# Patient Record
Sex: Female | Born: 1950 | Race: Black or African American | Hispanic: No | Marital: Married | Smoking: Current every day smoker
Health system: Southern US, Community
[De-identification: ages and names within clinical notes are randomized; demographics above are authoritative.]

---

## 1998-11-14 ENCOUNTER — Inpatient Hospital Stay (HOSPITAL_COMMUNITY): Admission: AD | Admit: 1998-11-14 | Discharge: 1998-11-14 | Payer: Self-pay | Admitting: *Deleted

## 1999-08-20 ENCOUNTER — Encounter: Payer: Self-pay | Admitting: *Deleted

## 1999-08-20 ENCOUNTER — Emergency Department (HOSPITAL_COMMUNITY): Admission: EM | Admit: 1999-08-20 | Discharge: 1999-08-20 | Payer: Self-pay | Admitting: Emergency Medicine

## 1999-08-20 ENCOUNTER — Ambulatory Visit (HOSPITAL_COMMUNITY): Admission: RE | Admit: 1999-08-20 | Discharge: 1999-08-20 | Payer: Self-pay | Admitting: *Deleted

## 1999-08-20 ENCOUNTER — Encounter: Payer: Self-pay | Admitting: Emergency Medicine

## 2009-03-14 ENCOUNTER — Emergency Department (HOSPITAL_COMMUNITY): Admission: EM | Admit: 2009-03-14 | Discharge: 2009-03-14 | Payer: Self-pay | Admitting: Emergency Medicine

## 2011-10-11 ENCOUNTER — Emergency Department (INDEPENDENT_AMBULATORY_CARE_PROVIDER_SITE_OTHER)
Admission: EM | Admit: 2011-10-11 | Discharge: 2011-10-11 | Disposition: A | Discharge status: Home / Self Care | Payer: Self-pay | Source: Home / Self Care | Attending: Family Medicine | Admitting: Family Medicine

## 2011-10-11 ENCOUNTER — Emergency Department (INDEPENDENT_AMBULATORY_CARE_PROVIDER_SITE_OTHER): Payer: Self-pay

## 2011-10-11 ENCOUNTER — Encounter (HOSPITAL_COMMUNITY): Payer: Self-pay | Admitting: *Deleted

## 2011-10-11 DIAGNOSIS — S92919A Unspecified fracture of unspecified toe(s), initial encounter for closed fracture: Secondary | ICD-10-CM

## 2011-10-11 DIAGNOSIS — S92402A Displaced unspecified fracture of left great toe, initial encounter for closed fracture: Secondary | ICD-10-CM

## 2011-10-11 MED ORDER — HYDROCODONE-ACETAMINOPHEN 5-325 MG PO TABS: 1.0000 | ORAL_TABLET | Freq: Four times a day (QID) | ORAL | Status: AC | PRN

## 2011-10-11 NOTE — ED Notes (Signed)
Reports jamming left great toe on stair step @ 0100 this AM.  Ecchymosis noted to joint.  C/O painful ambulation.

## 2011-10-11 NOTE — ED Notes (Signed)
Left great toe digital block performed per Dr. Artis Flock, followed by reduction.  Patient tolerated well.

## 2011-10-11 NOTE — ED Provider Notes (Signed)
History     CSN: [id number]  Arrival date & time 10/11/11  1330   First MD Initiated Contact with Patient 10/11/11 1411      Chief Complaint  Patient presents with  . Toe Injury    (Consider location/radiation/quality/duration/timing/severity/associated sxs/prior treatment) Patient is a 61 y.o. female presenting with toe pain. The history is provided by the patient.  Toe Pain This is a new problem. The current episode started 12 to 24 hours ago. The problem has not changed since onset.   History reviewed. No pertinent past medical history.  History reviewed. No pertinent past surgical history.  No family history on file.  History  Substance Use Topics  . Smoking status: Current Everyday Smoker -- 0.5 packs/day  . Smokeless tobacco: Not on file  . Alcohol Use: Yes     Occasional    OB History    Grav Para Term Preterm Abortions TAB SAB Ect Mult Living                  Review of Systems  Musculoskeletal: Positive for joint swelling and gait problem.    Allergies  Review of patient's allergies indicates no known allergies.  Home Medications  No current outpatient prescriptions on file.  BP 119/80  Pulse 79  Temp 98 F (36.7 C) (Oral)  Resp 19  SpO2 100%  Physical Exam  Nursing note and vitals reviewed. Constitutional: She is oriented to person, place, and time. She appears well-developed and well-nourished.  Musculoskeletal: She exhibits tenderness.  Neurological: She is alert and oriented to person, place, and time.  Skin: Skin is warm and dry.    ED Course  Reduction of fracture Date/Time: 10/11/2011 3:21 PM Performed by: Linna Hoff Authorized by: Bradd Canary D Consent: Verbal consent obtained. Consent given by: patient Patient understanding: patient states understanding of the procedure being performed Preparation: Patient was prepped and draped in the usual sterile fashion. Local anesthesia used: yes Anesthesia: digital block Local  anesthetic: lidocaine 2% without epinephrine Anesthetic total: 3 ml Patient sedated: no Patient tolerance: Patient tolerated the procedure well with no immediate complications. Comments: Post red x-ray improved alignment.   (including critical care time)  Labs Reviewed - No data to display Dg Foot 2 Views Left  10/11/2011  *RADIOLOGY REPORT*  Clinical Data: Toe fracture.  Post reduction images.  LEFT FOOT - 2 VIEW  Comparison: 10/11/2011.  Findings: Alignment of the proximal phalanx of the left great toe is improved, with less apex medial angulation at the oblique fracture site.  On the frontal view, the alignment is near anatomic.  There is minimal apex volar angulation of the lateral view.  The first and second toes appear taped together.  IMPRESSION: Improved alignment of great toe proximal phalanx fracture.  Original Report Authenticated By: Andreas Newport, M.D.   Dg Foot Complete Left  10/11/2011  *RADIOLOGY REPORT*  Clinical Data: Blunt trauma to the left foot, now with obvious deformity of the left great toe  LEFT FOOT - COMPLETE 3+ VIEW  Comparison: None.  Findings: There is a minimally displaced fracture of the mid aspect of the proximal phalanx of the great toe, apex valgus and plantar. This fracture is without definite extension to either in the IP or the MTP joints.  Possible nondisplaced fracture of the medial aspect of the proximal epiphysis of the distal phalanx of the great toe.  Expected adjacent soft tissue swelling.  No radiopaque foreign body.  IMPRESSION: 1.  Minimally displaced fracture  of the proximal phalanx of the great toe without definite intra-articular extension.  2. Possible nondisplaced fracture of the medial aspect of the proximal epiphyses of the distal phalanx of the great toe.  Original Report Authenticated By: Waynard Reeds, M.D.     1. Fracture of great toe, left, closed       MDM  X-rays reviewed and report per radiologist.   Discussed with dr Dion Saucier,  treatment as advised.         Linna Hoff, MD 10/11/11 [phone]

## 2017-01-09 ENCOUNTER — Encounter (HOSPITAL_COMMUNITY): Payer: Self-pay | Admitting: Family Medicine

## 2017-01-09 ENCOUNTER — Ambulatory Visit (HOSPITAL_COMMUNITY)
Admission: EM | Admit: 2017-01-09 | Discharge: 2017-01-09 | Disposition: A | Discharge status: Home / Self Care | Payer: Medicare Other | Attending: Physician Assistant | Admitting: Physician Assistant

## 2017-01-09 DIAGNOSIS — H1032 Unspecified acute conjunctivitis, left eye: Secondary | ICD-10-CM

## 2017-01-09 MED ORDER — FLUORESCEIN SODIUM 0.6 MG OP STRP
ORAL_STRIP | OPHTHALMIC | Status: AC
  Filled 2017-01-09: qty 1

## 2017-01-09 MED ORDER — SULFACETAMIDE SODIUM 10 % OP SOLN: 1.0000 [drp] | Freq: Four times a day (QID) | OPHTHALMIC | 0 refills | Status: AC

## 2017-01-09 MED ORDER — TETRACAINE HCL 0.5 % OP SOLN
OPHTHALMIC | Status: AC
  Filled 2017-01-09: qty 4

## 2017-01-09 NOTE — Discharge Instructions (Signed)
Use sulfacetamide drops as directed. Use over the counter lid scrubs or baby shampoo to was the eyelid. Warm compress to help oil production. Do not rub the eye, keep good hand hygiene as you can pass the bacteria to your other eye, or to others. Monitor for any worsening of symptoms, changes in vision, sensitivity to light, swelling of the eye, go to the ophthalmologist for further evaluation.

## 2017-01-09 NOTE — ED Provider Notes (Signed)
[facility]    CSN: [id number] Arrival date & time: 01/09/17  1214     History   Chief Complaint Chief Complaint  Patient presents with  . Eye Problem    HPI Kaylee Bell is a 66 y.o. female.   66 year old healthy female comes in for left eye drainage, redness and swelling starting this morning. States she woke up with the symptoms. Denies vision changes. Has had some photophobia. States pain when eye is closed. Denies seasonal allergies. Denies contact lens use. Denies URI symptoms such as cough, congestion, sore throat. Denies fever, chills. Does have hot flashes. Denies injury to eye, foreign body. Does state that her son and grandchildren are all being treated for bacterial conjunctivitis and she has been playing with the grandchildren lately.       History reviewed. No pertinent past medical history.  There are no active problems to display for this patient.   History reviewed. No pertinent surgical history.  OB History    No data available       Home Medications    Prior to Admission medications   Medication Sig Start Date End Date Taking? Authorizing Provider  sulfacetamide (BLEPH-10) 10 % ophthalmic solution Place 1-2 drops into the left eye 4 (four) times daily. 01/09/17 01/14/17  Belinda Fisher, PA-C    Family History History reviewed. No pertinent family history.  Social History Social History  Substance Use Topics  . Smoking status: Current Every Day Smoker    Packs/day: 0.50  . Smokeless tobacco: Not on file  . Alcohol use Yes     Comment: Occasional     Allergies   Patient has no known allergies.   Review of Systems Review of Systems  Reason unable to perform ROS: See HPI as above.     Physical Exam Triage Vital Signs ED Triage Vitals [01/09/17 1342]  Enc Vitals Group     BP 121/76     Pulse Rate 66     Resp 18     Temp 98.3 F (36.8 C)     Temp src      SpO2 100 %     Weight      Height      Head Circumference        Peak Flow      Pain Score 10     Pain Loc      Pain Edu?      Excl. in [place]?    No data found.   Updated Vital Signs BP 121/76   Pulse 66   Temp 98.3 F (36.8 C)   Resp 18   SpO2 100%   Physical Exam  Constitutional: She is oriented to person, place, and time. She appears well-developed and well-nourished. No distress.  HENT:  Head: Normocephalic and atraumatic.  Right Ear: Tympanic membrane, external ear and ear canal normal. Tympanic membrane is not erythematous and not bulging.  Left Ear: Tympanic membrane, external ear and ear canal normal. Tympanic membrane is not erythematous and not bulging.  Nose: Nose normal. Right sinus exhibits no maxillary sinus tenderness and no frontal sinus tenderness. Left sinus exhibits no maxillary sinus tenderness and no frontal sinus tenderness.  Mouth/Throat: Uvula is midline, oropharynx is clear and moist and mucous membranes are normal.  Eyes: Pupils are equal, round, and reactive to light. EOM and lids are normal. Lids are everted and swept, no foreign bodies found. Right conjunctiva is not injected. Left conjunctiva is injected.  Fluorescein stain without uptake.  Neck: Normal range of motion. Neck supple.  Cardiovascular: Normal rate, regular rhythm and normal heart sounds.  Exam reveals no gallop and no friction rub.   No murmur heard. Pulmonary/Chest: Effort normal and breath sounds normal. She has no decreased breath sounds. She has no wheezes. She has no rhonchi. She has no rales.  Lymphadenopathy:    She has no cervical adenopathy.  Neurological: She is alert and oriented to person, place, and time.  Skin: Skin is warm and dry.  Psychiatric: She has a normal mood and affect. Her behavior is normal. Judgment normal.     UC Treatments / Results  Labs (all labs ordered are listed, but only abnormal results are displayed) Labs Reviewed - No data to display  EKG  EKG Interpretation None       Radiology No results  found.  Procedures Procedures (including critical care time)  Medications Ordered in UC Medications - No data to display   Initial Impression / Assessment and Plan / UC Course  I have reviewed the triage vital signs and the nursing notes.  Pertinent labs & imaging results that were available during my care of the patient were reviewed by me and considered in my medical decision making (see chart for details).     History and exam most consistent with bacterial conjunctivitis. Start sulfacetamide as directed. Lid scrubs and warm compress instructions given. If symptoms worsens, does not resolve, follow up with ophthalmology for further evaluation. Resources given.  Final Clinical Impressions(s) / UC Diagnoses   Final diagnoses:  Acute bacterial conjunctivitis of left eye    New Prescriptions New Prescriptions   SULFACETAMIDE (BLEPH-10) 10 % OPHTHALMIC SOLUTION    Place 1-2 drops into the left eye 4 (four) times daily.       Belinda Fisher, PA-C 01/09/17 1452

## 2017-01-09 NOTE — ED Triage Notes (Signed)
Per pt she woke up with eye drainage, redness and swelling.

## 2018-02-09 ENCOUNTER — Other Ambulatory Visit: Payer: Self-pay | Admitting: Family

## 2018-02-09 DIAGNOSIS — I10 Essential (primary) hypertension: Secondary | ICD-10-CM | POA: Diagnosis not present

## 2018-02-09 DIAGNOSIS — E559 Vitamin D deficiency, unspecified: Secondary | ICD-10-CM | POA: Diagnosis not present

## 2018-02-09 DIAGNOSIS — Z008 Encounter for other general examination: Secondary | ICD-10-CM | POA: Diagnosis not present

## 2018-02-09 DIAGNOSIS — Z13820 Encounter for screening for osteoporosis: Secondary | ICD-10-CM

## 2018-02-09 DIAGNOSIS — Z1231 Encounter for screening mammogram for malignant neoplasm of breast: Secondary | ICD-10-CM

## 2018-02-09 DIAGNOSIS — E46 Unspecified protein-calorie malnutrition: Secondary | ICD-10-CM | POA: Diagnosis not present

## 2018-02-14 DIAGNOSIS — Z1211 Encounter for screening for malignant neoplasm of colon: Secondary | ICD-10-CM | POA: Diagnosis not present

## 2018-02-14 DIAGNOSIS — Z0000 Encounter for general adult medical examination without abnormal findings: Secondary | ICD-10-CM | POA: Diagnosis not present

## 2018-02-16 DIAGNOSIS — H5213 Myopia, bilateral: Secondary | ICD-10-CM | POA: Diagnosis not present

## 2018-02-16 DIAGNOSIS — H04123 Dry eye syndrome of bilateral lacrimal glands: Secondary | ICD-10-CM | POA: Diagnosis not present

## 2018-02-16 DIAGNOSIS — H2513 Age-related nuclear cataract, bilateral: Secondary | ICD-10-CM | POA: Diagnosis not present

## 2018-03-26 ENCOUNTER — Ambulatory Visit
Admission: RE | Admit: 2018-03-26 | Discharge: 2018-03-26 | Disposition: A | Discharge status: Home / Self Care | Payer: Medicare Other | Source: Ambulatory Visit | Attending: Family | Admitting: Family

## 2018-03-26 DIAGNOSIS — Z1231 Encounter for screening mammogram for malignant neoplasm of breast: Secondary | ICD-10-CM | POA: Diagnosis not present

## 2018-03-26 DIAGNOSIS — M8589 Other specified disorders of bone density and structure, multiple sites: Secondary | ICD-10-CM | POA: Diagnosis not present

## 2018-03-26 DIAGNOSIS — Z780 Asymptomatic menopausal state: Secondary | ICD-10-CM | POA: Diagnosis not present

## 2018-03-26 DIAGNOSIS — Z13820 Encounter for screening for osteoporosis: Secondary | ICD-10-CM

## 2019-03-18 ENCOUNTER — Encounter (HOSPITAL_COMMUNITY): Payer: Self-pay | Admitting: Emergency Medicine

## 2019-03-18 ENCOUNTER — Other Ambulatory Visit: Payer: Self-pay

## 2019-03-18 ENCOUNTER — Ambulatory Visit (HOSPITAL_COMMUNITY)
Admission: EM | Admit: 2019-03-18 | Discharge: 2019-03-18 | Disposition: A | Discharge status: Home / Self Care | Payer: Medicaid Other | Attending: Family Medicine | Admitting: Family Medicine

## 2019-03-18 DIAGNOSIS — L089 Local infection of the skin and subcutaneous tissue, unspecified: Secondary | ICD-10-CM

## 2019-03-18 DIAGNOSIS — L739 Follicular disorder, unspecified: Secondary | ICD-10-CM | POA: Diagnosis not present

## 2019-03-18 MED ORDER — DOXYCYCLINE HYCLATE 100 MG PO CAPS: 100.0000 mg | ORAL_CAPSULE | Freq: Two times a day (BID) | ORAL | 0 refills | Status: AC

## 2019-03-18 NOTE — Discharge Instructions (Signed)
Please begin taking doxycycline twice daily for the next 10 days Apply warm compresses to area to help with pain and swelling Follow up if no improving with the above, worsening, spreading, developing fever or neck stiffness

## 2019-03-18 NOTE — ED Triage Notes (Signed)
Pt here for an abscess on back of head/scalp onset 1 week... thinks it's d/t having a "perm"... Has pain when she touches area  Denies fevers  A&O x4... NAD.Marland Kitchen. ambulatory

## 2019-03-18 NOTE — ED Provider Notes (Addendum)
[facility]    CSN: [id number] Arrival date & time: 03/18/19  1751      History   Chief Complaint Chief Complaint  Patient presents with  . Abscess    HPI Kaylee Bell is a 68 y.o. female history of tobacco use presenting today for evaluation of a bump to her scalp.  Patient states that over the past week she has had increased pain swelling and drainage to an area at the back of her scalp/neck area.  She notes that she has had a swollen area here for a while but that typically does not bother her.  States it started after she received a perm.  Denies fevers.  Denies neck stiffness.  Denies ear pain or change in hearing.  She has been applying Vaseline.  Denies history of similar.  HPI  History reviewed. No pertinent past medical history.  There are no problems to display for this patient.   History reviewed. No pertinent surgical history.  OB History   No obstetric history on file.      Home Medications    Prior to Admission medications   Medication Sig Start Date End Date Taking? Authorizing Provider  doxycycline (VIBRAMYCIN) 100 MG capsule Take 1 capsule (100 mg total) by mouth 2 (two) times daily for 10 days. Take with food. 03/18/19 03/28/19  Demeshia Sherburne, Elesa Hacker, PA-C    Family History History reviewed. No pertinent family history.  Social History Social History   Tobacco Use  . Smoking status: Current Every Day Smoker    Packs/day: 0.50  . Smokeless tobacco: Never Used  Substance Use Topics  . Alcohol use: Yes    Comment: Occasional  . Drug use: No     Allergies   Patient has no known allergies.   Review of Systems Review of Systems  Constitutional: Negative for fatigue and fever.  Eyes: Negative for visual disturbance.  Respiratory: Negative for shortness of breath.   Cardiovascular: Negative for chest pain.  Gastrointestinal: Negative for abdominal pain, nausea and vomiting.  Musculoskeletal: Positive for neck pain. Negative  for arthralgias, joint swelling and neck stiffness.  Skin: Positive for color change and wound. Negative for rash.  Neurological: Negative for dizziness, weakness, light-headedness and headaches.     Physical Exam Triage Vital Signs ED Triage Vitals  Enc Vitals Group     BP 03/18/19 1004 127/84     Pulse Rate 03/18/19 1004 77     Resp 03/18/19 1004 16     Temp 03/18/19 1004 98.6 F (37 C)     Temp Source 03/18/19 1004 Oral     SpO2 03/18/19 1004 100 %     Weight --      Height --      Head Circumference --      Peak Flow --      Pain Score 03/18/19 1006 0     Pain Loc --      Pain Edu? --      Excl. in [place]? --    No data found.  Updated Vital Signs BP 127/84 (BP Location: Left Arm)   Pulse 77   Temp 98.6 F (37 C) (Oral)   Resp 16   SpO2 100%   Visual Acuity Right Eye Distance:   Left Eye Distance:   Bilateral Distance:    Right Eye Near:   Left Eye Near:    Bilateral Near:     Physical Exam Vitals and nursing note reviewed.  Constitutional:  Appearance: She is well-developed.     Comments: No acute distress  HENT:     Head: Normocephalic and atraumatic.      Comments: Right lower lateral scalp with area of swelling and fluctuance, inferior portion appears erythematous, crusting, tender to touch    Ears:     Comments: Bilateral ears without tenderness to palpation of external auricle, tragus and mastoid, EAC's without erythema or swelling, TM's with good bony landmarks and cone of light. Non erythematous.    Nose: Nose normal.  Eyes:     Conjunctiva/sclera: Conjunctivae normal.  Neck:     Comments: Full active range of motion of neck Cardiovascular:     Rate and Rhythm: Normal rate.  Pulmonary:     Effort: Pulmonary effort is normal. No respiratory distress.  Abdominal:     General: There is no distension.  Musculoskeletal:        General: Normal range of motion.     Cervical back: Neck supple.  Skin:    General: Skin is warm and dry.    Neurological:     Mental Status: She is alert and oriented to person, place, and time.      UC Treatments / Results  Labs (all labs ordered are listed, but only abnormal results are displayed) Labs Reviewed - No data to display  EKG   Radiology No results found.  Procedures Procedures (including critical care time)  Medications Ordered in UC Medications - No data to display  Initial Impression / Assessment and Plan / UC Course  I have reviewed the triage vital signs and the nursing notes.  Pertinent labs & imaging results that were available during my care of the patient were reviewed by me and considered in my medical decision making (see chart for details).     Patient appears to have likely epidermal cyst with secondary infection versus folliculitis given location.  Will treat with doxycycline twice daily for 10 days with warm compresses.  Already actively draining, will defer further drainage at this time.  Only inferior portion of the cyst appears infected/affected.   Discussed strict return precautions. Patient verbalized understanding and is agreeable with plan.  Final Clinical Impressions(s) / UC Diagnoses   Final diagnoses:  Folliculitis  Infected cyst of skin     Discharge Instructions     Please begin taking doxycycline twice daily for the next 10 days Apply warm compresses to area to help with pain and swelling Follow up if no improving with the above, worsening, spreading, developing fever or neck stiffness    ED Prescriptions    Medication Sig Dispense Auth. Provider   doxycycline (VIBRAMYCIN) 100 MG capsule Take 1 capsule (100 mg total) by mouth 2 (two) times daily for 10 days. Take with food. 20 capsule Urijah Raynor, [place] C, PA-C     PDMP not reviewed this encounter.   Josette Shimabukuro, [place] C, PA-C 03/18/19 1021    Erland Vivas, [place] C, PA-C 03/18/19 1021

## 2019-04-28 ENCOUNTER — Other Ambulatory Visit: Payer: Self-pay | Admitting: Family

## 2019-04-28 DIAGNOSIS — Z1231 Encounter for screening mammogram for malignant neoplasm of breast: Secondary | ICD-10-CM

## 2019-05-26 ENCOUNTER — Ambulatory Visit: Payer: Medicare HMO

## 2019-07-01 ENCOUNTER — Other Ambulatory Visit: Payer: Self-pay

## 2019-07-01 ENCOUNTER — Ambulatory Visit
Admission: RE | Admit: 2019-07-01 | Discharge: 2019-07-01 | Disposition: A | Discharge status: Home / Self Care | Payer: Medicare HMO | Source: Ambulatory Visit | Attending: Family | Admitting: Family

## 2019-07-01 DIAGNOSIS — Z1231 Encounter for screening mammogram for malignant neoplasm of breast: Secondary | ICD-10-CM

## 2020-05-23 ENCOUNTER — Other Ambulatory Visit: Payer: Self-pay | Admitting: Family

## 2020-05-23 DIAGNOSIS — Z1231 Encounter for screening mammogram for malignant neoplasm of breast: Secondary | ICD-10-CM

## 2020-07-12 ENCOUNTER — Other Ambulatory Visit: Payer: Self-pay

## 2020-07-12 ENCOUNTER — Ambulatory Visit
Admission: RE | Admit: 2020-07-12 | Discharge: 2020-07-12 | Disposition: A | Discharge status: Home / Self Care | Payer: Medicare Other | Source: Ambulatory Visit | Attending: Family | Admitting: Family

## 2020-07-12 DIAGNOSIS — Z1231 Encounter for screening mammogram for malignant neoplasm of breast: Secondary | ICD-10-CM

## 2020-07-16 ENCOUNTER — Other Ambulatory Visit: Payer: Self-pay | Admitting: Family

## 2020-07-16 DIAGNOSIS — R928 Other abnormal and inconclusive findings on diagnostic imaging of breast: Secondary | ICD-10-CM

## 2020-09-04 ENCOUNTER — Other Ambulatory Visit: Payer: Medicare Other

## 2020-09-18 ENCOUNTER — Ambulatory Visit
Admission: RE | Admit: 2020-09-18 | Discharge: 2020-09-18 | Disposition: A | Discharge status: Home / Self Care | Payer: Medicare Other | Source: Ambulatory Visit | Attending: Family | Admitting: Family

## 2020-09-18 ENCOUNTER — Other Ambulatory Visit: Payer: Self-pay

## 2020-09-18 DIAGNOSIS — R928 Other abnormal and inconclusive findings on diagnostic imaging of breast: Secondary | ICD-10-CM

## 2021-06-03 ENCOUNTER — Other Ambulatory Visit: Payer: Self-pay | Admitting: Family

## 2021-06-03 DIAGNOSIS — Z1231 Encounter for screening mammogram for malignant neoplasm of breast: Secondary | ICD-10-CM

## 2021-07-15 ENCOUNTER — Ambulatory Visit: Payer: Medicare Other

## 2021-07-15 ENCOUNTER — Ambulatory Visit
Admission: RE | Admit: 2021-07-15 | Discharge: 2021-07-15 | Disposition: A | Discharge status: Home / Self Care | Payer: Medicare Other | Source: Ambulatory Visit | Attending: Family | Admitting: Family

## 2021-07-15 DIAGNOSIS — Z1231 Encounter for screening mammogram for malignant neoplasm of breast: Secondary | ICD-10-CM

## 2021-07-16 ENCOUNTER — Other Ambulatory Visit: Payer: Self-pay | Admitting: Family

## 2021-07-16 DIAGNOSIS — R928 Other abnormal and inconclusive findings on diagnostic imaging of breast: Secondary | ICD-10-CM

## 2021-08-12 ENCOUNTER — Other Ambulatory Visit: Payer: Self-pay | Admitting: Family

## 2021-08-12 ENCOUNTER — Ambulatory Visit
Admission: RE | Admit: 2021-08-12 | Discharge: 2021-08-12 | Disposition: A | Discharge status: Home / Self Care | Payer: Medicaid Other | Source: Ambulatory Visit | Attending: Family | Admitting: Family

## 2021-08-12 DIAGNOSIS — R928 Other abnormal and inconclusive findings on diagnostic imaging of breast: Secondary | ICD-10-CM

## 2021-08-12 DIAGNOSIS — N6310 Unspecified lump in the right breast, unspecified quadrant: Secondary | ICD-10-CM

## 2021-08-16 ENCOUNTER — Ambulatory Visit
Admission: RE | Admit: 2021-08-16 | Discharge: 2021-08-16 | Disposition: A | Discharge status: Home / Self Care | Payer: Medicaid Other | Source: Ambulatory Visit | Attending: Family | Admitting: Family

## 2021-08-16 DIAGNOSIS — N6310 Unspecified lump in the right breast, unspecified quadrant: Secondary | ICD-10-CM

## 2022-05-13 ENCOUNTER — Other Ambulatory Visit: Payer: Self-pay | Admitting: Family

## 2022-05-13 DIAGNOSIS — F17210 Nicotine dependence, cigarettes, uncomplicated: Secondary | ICD-10-CM

## 2022-07-09 ENCOUNTER — Other Ambulatory Visit: Payer: Self-pay | Admitting: Family

## 2022-07-09 DIAGNOSIS — Z1231 Encounter for screening mammogram for malignant neoplasm of breast: Secondary | ICD-10-CM

## 2022-08-21 ENCOUNTER — Ambulatory Visit
Admission: RE | Admit: 2022-08-21 | Discharge: 2022-08-21 | Disposition: A | Discharge status: Home / Self Care | Payer: 59 | Source: Ambulatory Visit | Attending: Family | Admitting: Family

## 2022-08-21 DIAGNOSIS — Z1231 Encounter for screening mammogram for malignant neoplasm of breast: Secondary | ICD-10-CM

## 2023-08-13 ENCOUNTER — Other Ambulatory Visit: Payer: Self-pay | Admitting: Family Medicine

## 2023-08-13 DIAGNOSIS — Z1231 Encounter for screening mammogram for malignant neoplasm of breast: Secondary | ICD-10-CM

## 2023-08-26 ENCOUNTER — Ambulatory Visit
Admission: RE | Admit: 2023-08-26 | Discharge: 2023-08-26 | Disposition: A | Discharge status: Home / Self Care | Source: Ambulatory Visit | Attending: Family Medicine | Admitting: Family Medicine

## 2023-08-26 DIAGNOSIS — Z1231 Encounter for screening mammogram for malignant neoplasm of breast: Secondary | ICD-10-CM

## 2024-01-01 IMAGING — US US  BREAST BX W/ LOC DEV 1ST LESION IMG BX SPEC US GUIDE*R*
1 series · 11 of 11 positions shown · non-contrast
Comparison: None Available.
COMPARISON: None Available.

Addendum:
CLINICAL DATA: Patient presents for ultrasound-guided core needle
biopsy of a right breast mass.

EXAM:
ULTRASOUND GUIDED RIGHT BREAST CORE NEEDLE BIOPSY

[Series 1: us breast bx w/ loc dev 1st lesion img bx spec us  · 0.06mm/px · 11 of 11 slices shown]
[im 1/11]
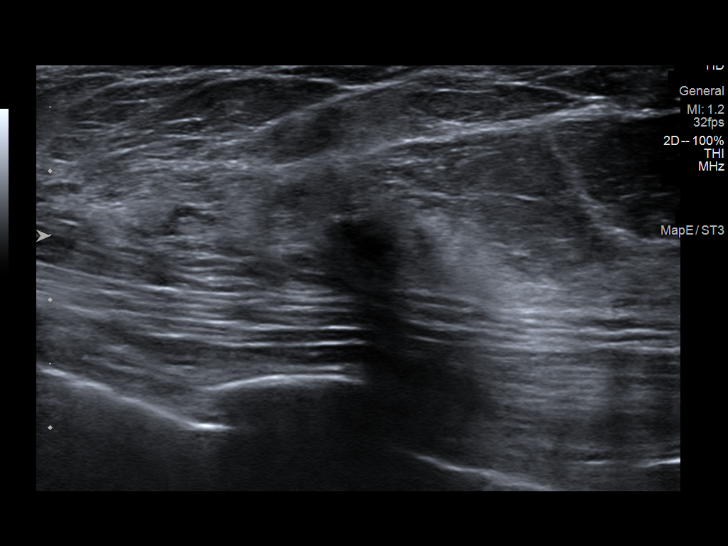
[im 2/11]
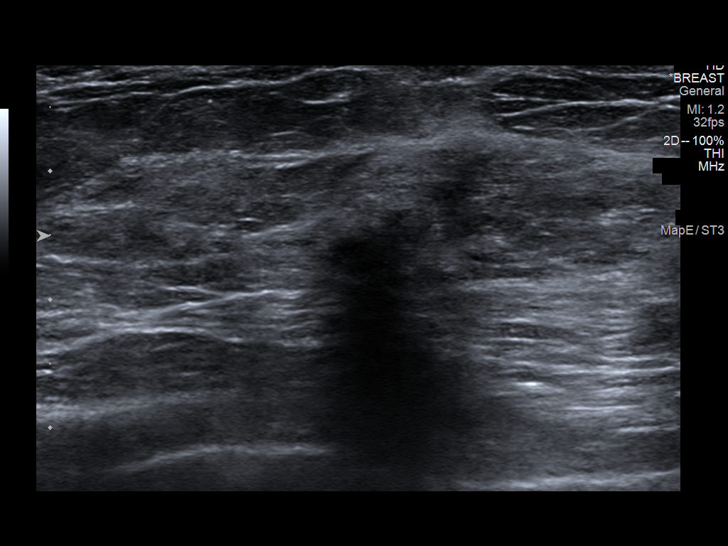
[im 3/11]
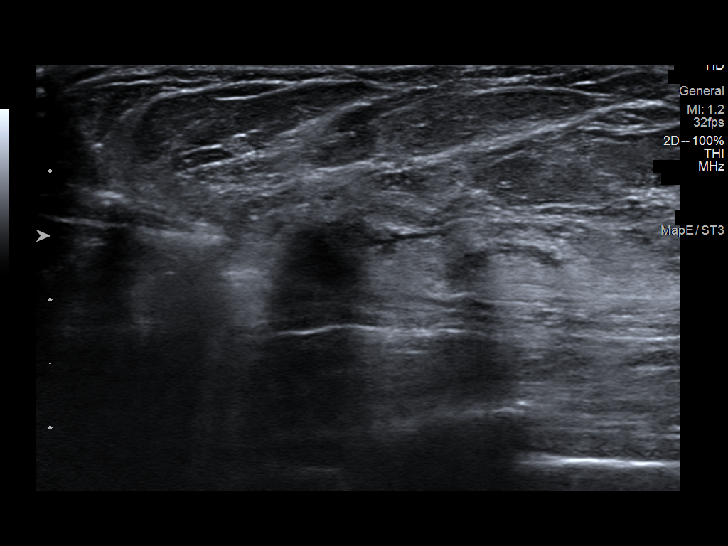
[im 4/11]
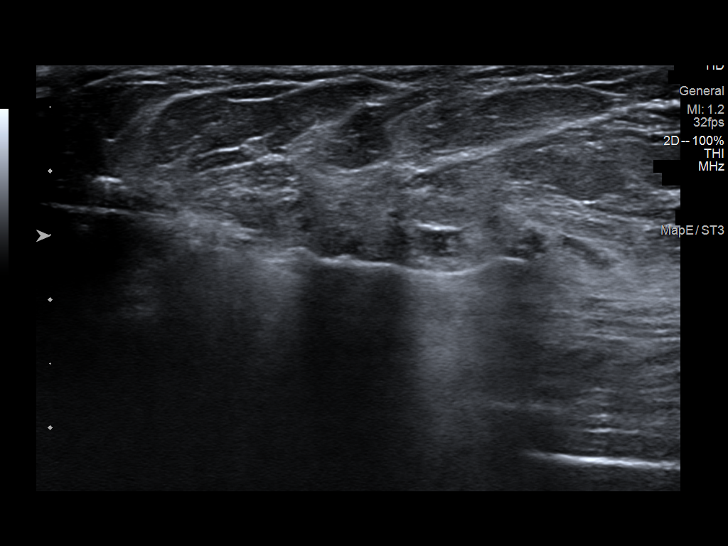
[im 5/11]
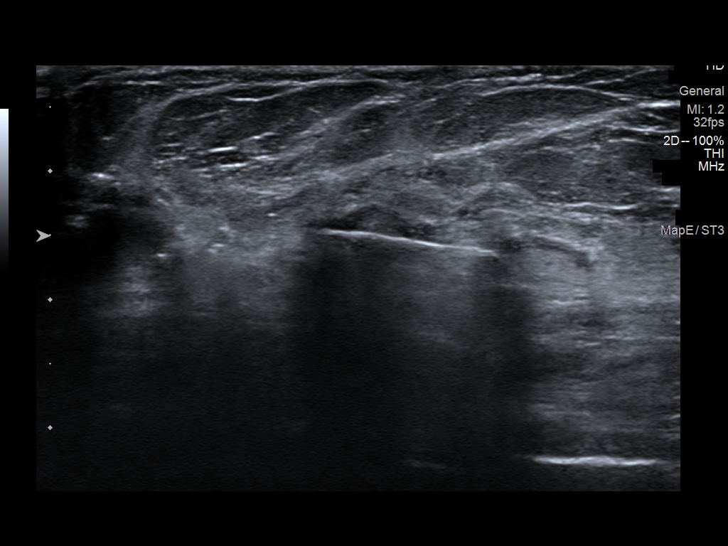
[im 6/11]
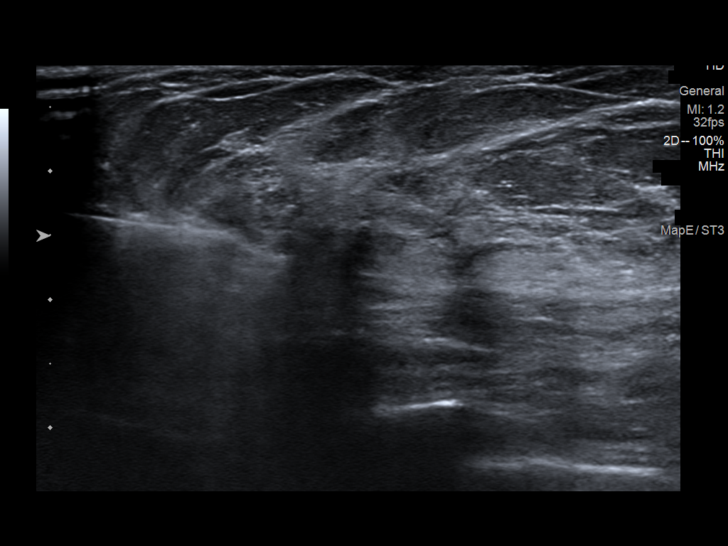
[im 7/11]
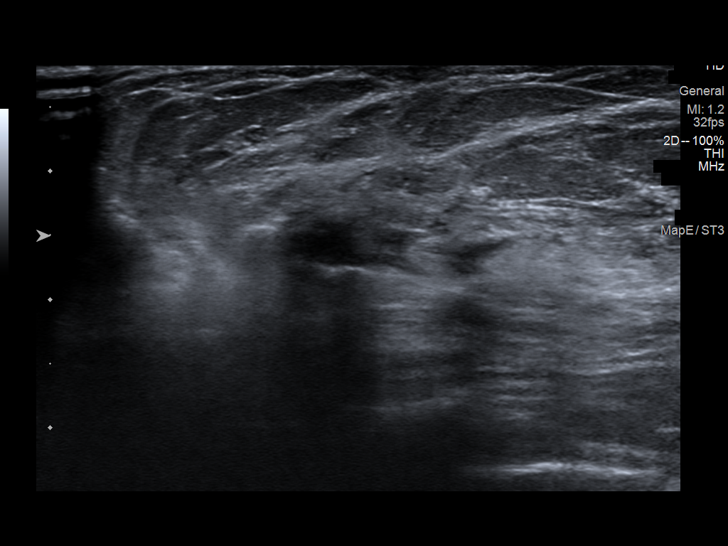
[im 8/11]
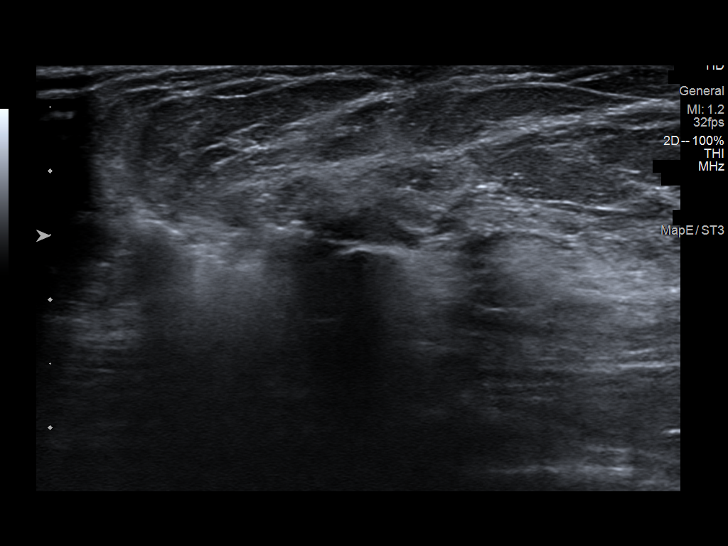
[im 9/11]
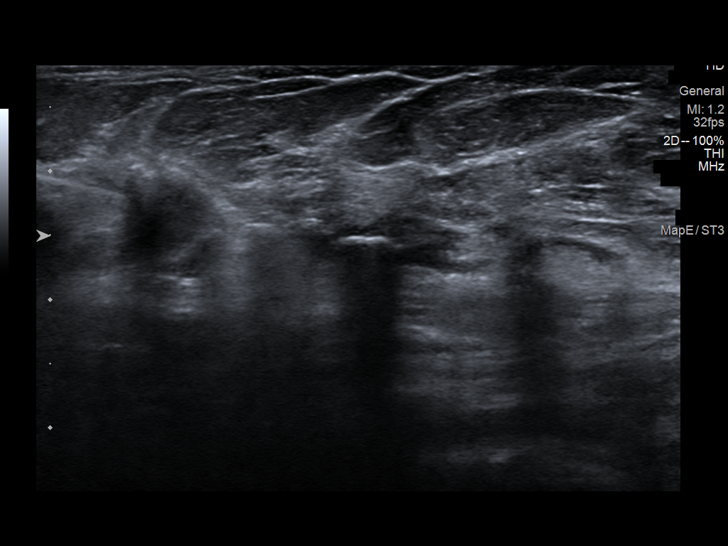
[im 10/11]
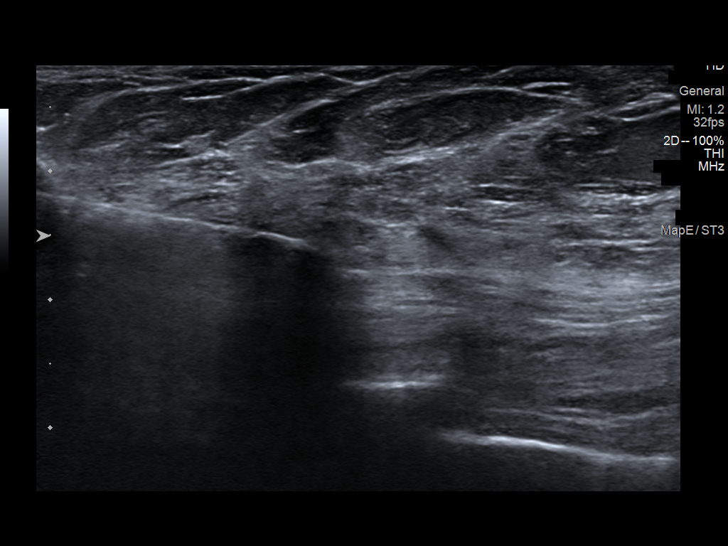
[im 11/11]
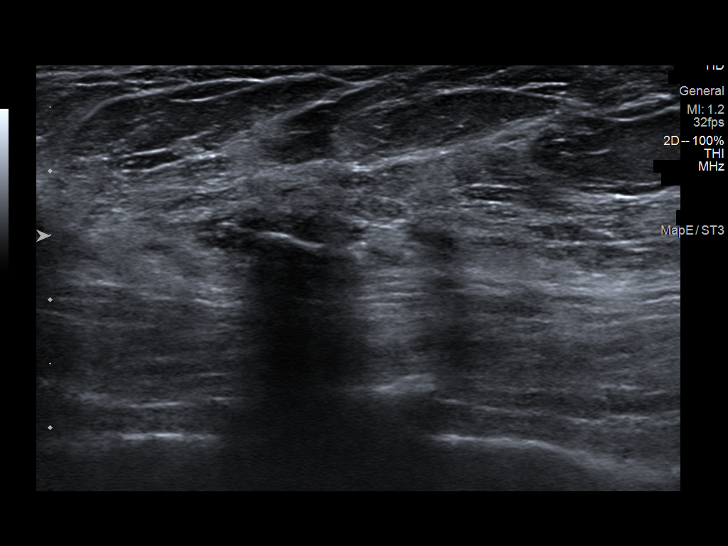

[11 of 11 positions shown; findings below may reference images not displayed]



Lesion quadrant: Upper outer quadrant

Using sterile technique and 1% Lidocaine as local anesthetic, under
direct ultrasound visualization, a 12 gauge Svein Helge device was
used to perform biopsy of the ill-defined, hypoechoic shadowing
lesion in the right breast at 10 o'clock, 7 cm the nipple, using an
inferior approach. At the conclusion of the procedure a ribbon
shaped tissue marker clip was deployed into the biopsy cavity.
Follow up 2 view mammogram was performed and dictated separately.
IMPRESSION: Ultrasound guided biopsy of a right breast mass/lesion. No apparent
complications.

ADDENDUM:
Pathology revealed FEATURES CONSISTENT WITH HEALED FAT NECROSIS
(SCAR)- NO MALIGNANCY IDENTIFIED of the RIGHT breast, 10 o'clock,
1cmfn (ribbon clip). This was found to be concordant by Dr. Goracci
Niu.

Pathology results were discussed with the patient by telephone with
Razali Menanti RN Nurse Navigator. The patient reported doing well
after the biopsy with tenderness at the site. Post biopsy
instructions and care were reviewed and questions were answered. The
patient was encouraged to call [REDACTED] for any additional concerns.

The patient was instructed to return for annual screening
mammography due July 2022, and informed a reminder notice would be
sent regarding this appointment.

Pathology results reported by Tutvumisreisid Schults RN on 08/20/2021.



Lesion quadrant: Upper outer quadrant

Using sterile technique and 1% Lidocaine as local anesthetic, under
direct ultrasound visualization, a 12 gauge Svein Helge device was
used to perform biopsy of the ill-defined, hypoechoic shadowing
lesion in the right breast at 10 o'clock, 7 cm the nipple, using an
inferior approach. At the conclusion of the procedure a ribbon
shaped tissue marker clip was deployed into the biopsy cavity.
Follow up 2 view mammogram was performed and dictated separately.
IMPRESSION: Ultrasound guided biopsy of a right breast mass/lesion. No apparent
complications.

## 2024-01-01 IMAGING — MG MM BREAST LOCALIZATION CLIP
4 series · 4 of 12 positions shown · non-contrast
Comparison: Previous exam(s).

CLINICAL DATA: Assess post biopsy marker clip placement following
ultrasound-guided core needle biopsy of a right breast lesion.

EXAM:
3D DIAGNOSTIC RIGHT MAMMOGRAM POST ULTRASOUND BIOPSY

[R ML synth-2D]
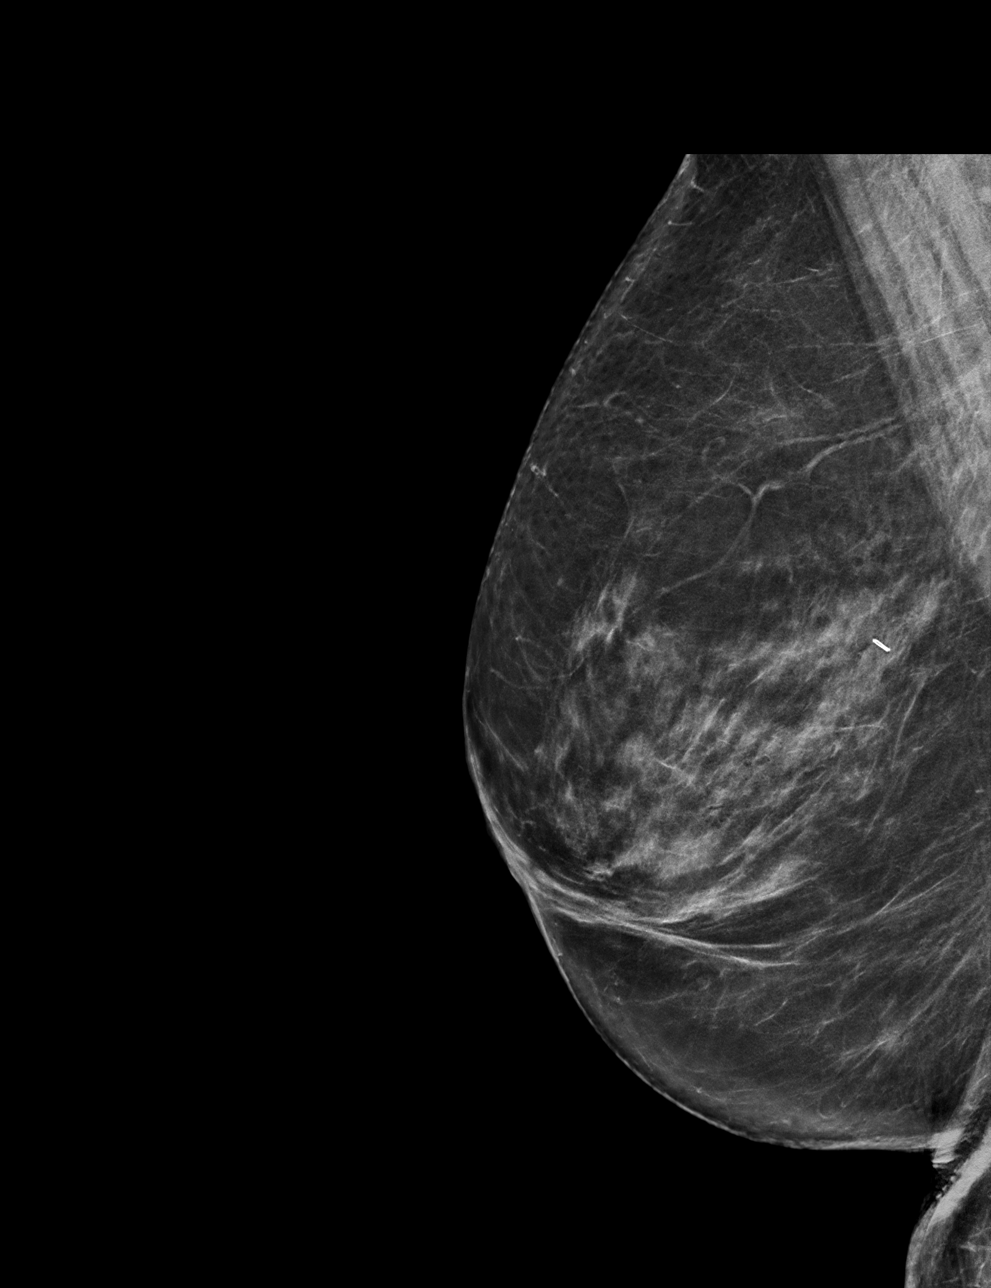

[R CC synth-2D]
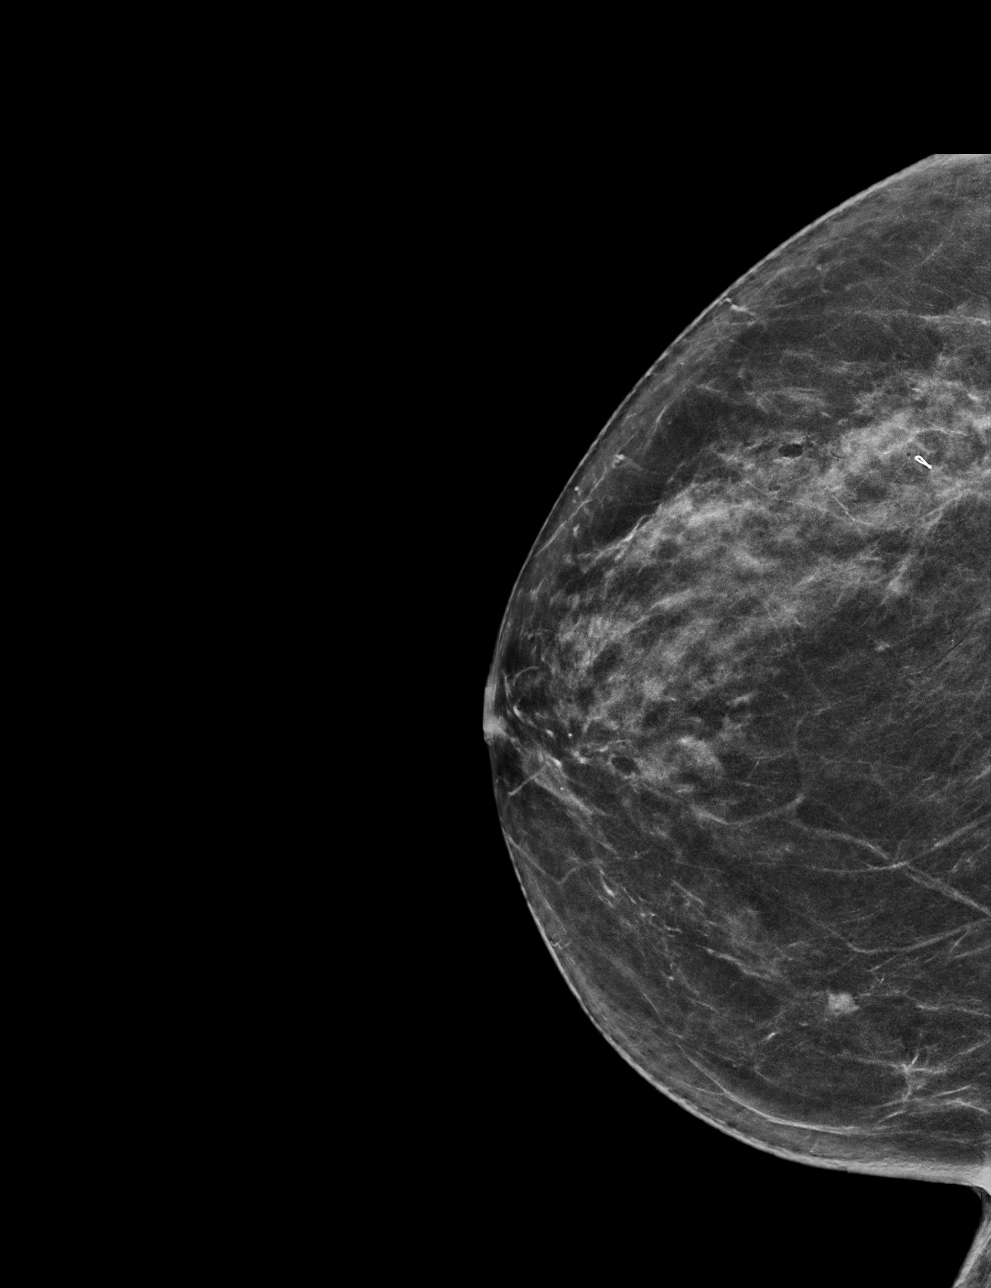

[R CC tomo · tomo slice 31/60.0]
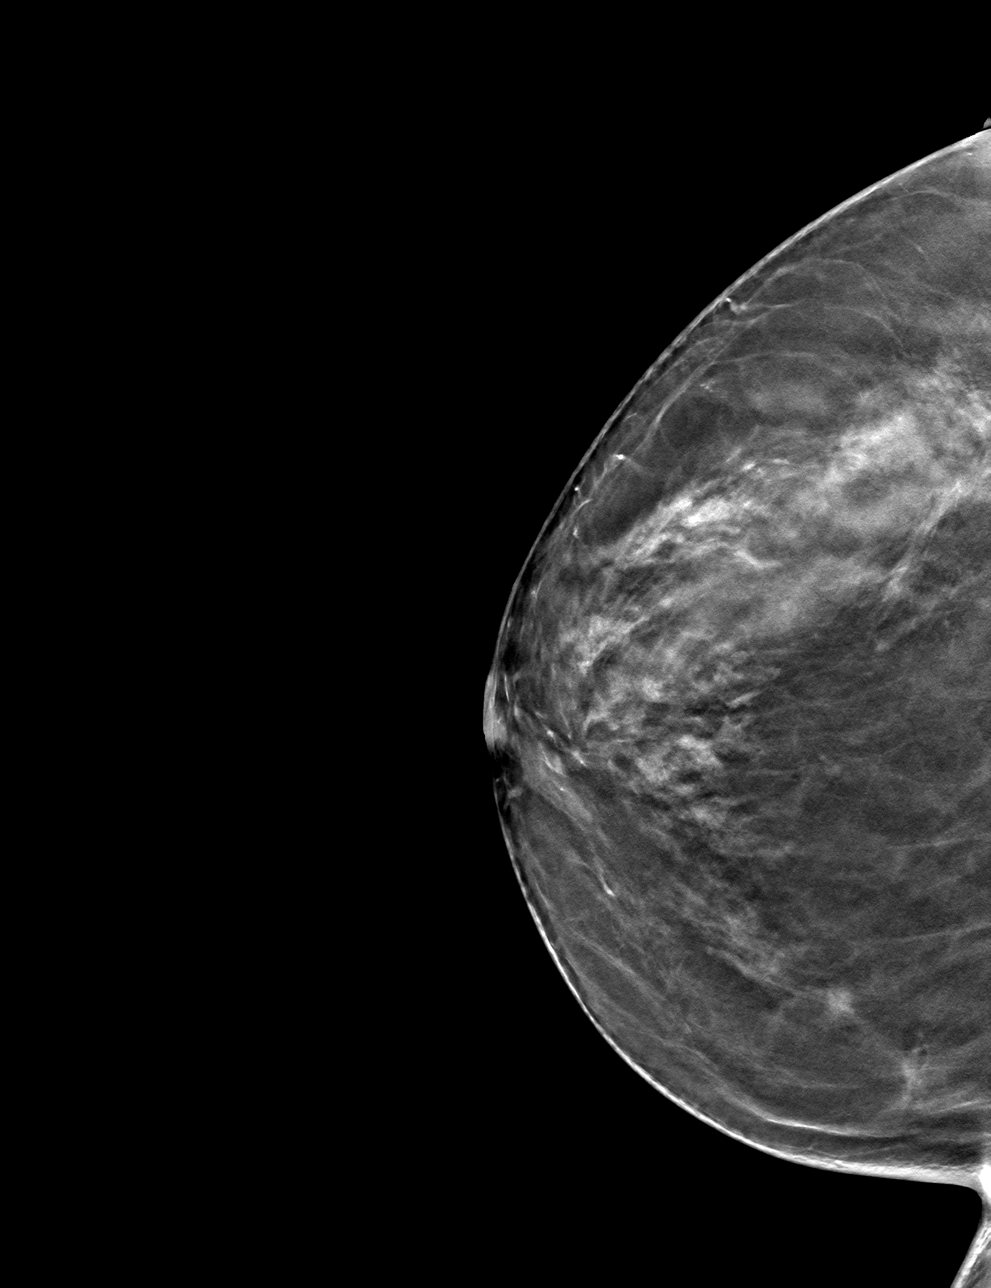

[R ML tomo · tomo slice 35/69.0]
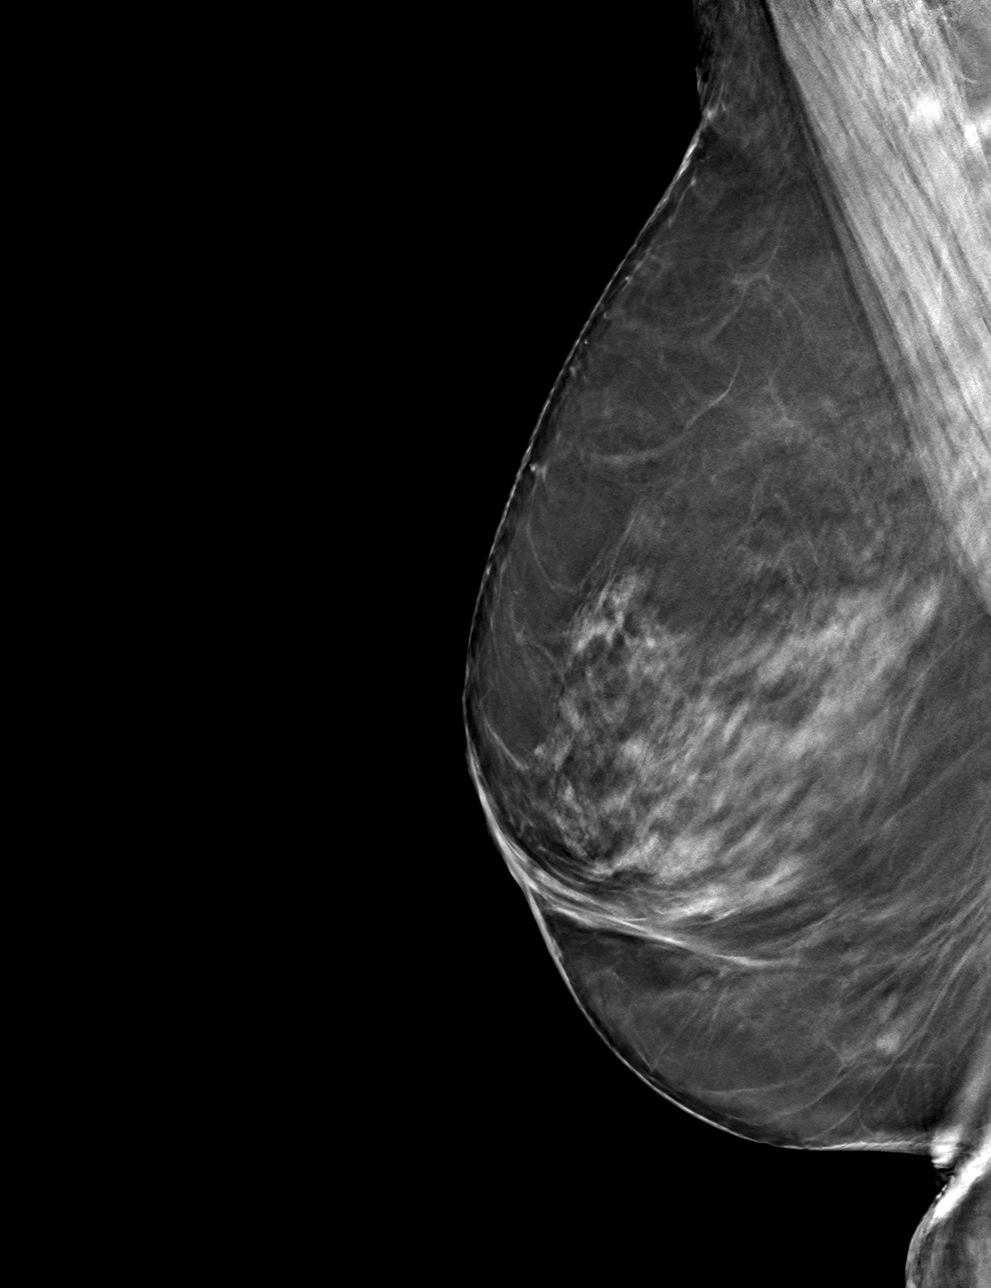

[4 of 12 positions shown; findings below may reference images not displayed]

FINDINGS: 3D Mammographic images were obtained following ultrasound guided
biopsy of a right breast lesion. The biopsy marking clip is in
expected position at the site of biopsy.
IMPRESSION: Appropriate positioning of the ribbon shaped biopsy marking clip at
the site of biopsy in the posterior, upper outer right breast,
corresponding to the area of asymmetry noted on the diagnostic
mammogram from 08/12/2021.

Final Assessment: Post Procedure Mammograms for Marker Placement

## 2024-01-30 ENCOUNTER — Ambulatory Visit

## 2024-01-30 DIAGNOSIS — Z23 Encounter for immunization: Secondary | ICD-10-CM
